# Patient Record
Sex: Male | Born: 1995 | Race: White | Hispanic: No | Marital: Single | Smoking: Current every day smoker
Health system: Southern US, Community
[De-identification: ages and names within clinical notes are randomized; demographics above are authoritative.]

## PROBLEM LIST (undated history)

## (undated) DIAGNOSIS — J45909 Unspecified asthma, uncomplicated: Secondary | ICD-10-CM

---
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
